# Patient Record
Sex: Female | Born: 1985 | Race: White | Hispanic: No | Marital: Single | Smoking: Never smoker
Health system: Southern US, Community
[De-identification: ages and names within clinical notes are randomized; demographics above are authoritative.]

## PROBLEM LIST (undated history)

## (undated) DIAGNOSIS — S060X9A Concussion with loss of consciousness of unspecified duration, initial encounter: Secondary | ICD-10-CM

## (undated) DIAGNOSIS — S060XAA Concussion with loss of consciousness status unknown, initial encounter: Secondary | ICD-10-CM

## (undated) HISTORY — PX: ANKLE SURGERY: SHX546

---

## 2006-04-23 ENCOUNTER — Other Ambulatory Visit: Admission: RE | Admit: 2006-04-23 | Discharge: 2006-04-23 | Payer: Self-pay | Admitting: Family Medicine

## 2008-02-07 ENCOUNTER — Other Ambulatory Visit: Admission: RE | Admit: 2008-02-07 | Discharge: 2008-02-07 | Payer: Self-pay | Admitting: Family Medicine

## 2008-04-14 ENCOUNTER — Emergency Department (HOSPITAL_COMMUNITY): Admission: EM | Admit: 2008-04-14 | Discharge: 2008-04-14 | Payer: Self-pay | Admitting: Emergency Medicine

## 2009-02-10 ENCOUNTER — Other Ambulatory Visit: Admission: RE | Admit: 2009-02-10 | Discharge: 2009-02-10 | Payer: Self-pay | Admitting: Family Medicine

## 2011-03-09 ENCOUNTER — Other Ambulatory Visit (HOSPITAL_COMMUNITY)
Admission: RE | Admit: 2011-03-09 | Discharge: 2011-03-09 | Disposition: A | Discharge status: Home / Self Care | Payer: BC Managed Care – PPO | Source: Ambulatory Visit | Attending: Family Medicine | Admitting: Family Medicine

## 2011-03-09 ENCOUNTER — Other Ambulatory Visit: Payer: Self-pay | Admitting: Physician Assistant

## 2011-03-09 DIAGNOSIS — Z0000 Encounter for general adult medical examination without abnormal findings: Secondary | ICD-10-CM | POA: Insufficient documentation

## 2013-06-06 ENCOUNTER — Encounter (HOSPITAL_COMMUNITY): Payer: Self-pay | Admitting: Emergency Medicine

## 2013-06-06 ENCOUNTER — Emergency Department (HOSPITAL_COMMUNITY): Payer: BC Managed Care – PPO

## 2013-06-06 ENCOUNTER — Emergency Department (HOSPITAL_COMMUNITY)
Admission: EM | Admit: 2013-06-06 | Discharge: 2013-06-06 | Disposition: A | Discharge status: Home / Self Care | Payer: BC Managed Care – PPO | Attending: Emergency Medicine | Admitting: Emergency Medicine

## 2013-06-06 DIAGNOSIS — S5010XA Contusion of unspecified forearm, initial encounter: Secondary | ICD-10-CM | POA: Insufficient documentation

## 2013-06-06 DIAGNOSIS — Y929 Unspecified place or not applicable: Secondary | ICD-10-CM | POA: Insufficient documentation

## 2013-06-06 DIAGNOSIS — S5011XA Contusion of right forearm, initial encounter: Secondary | ICD-10-CM

## 2013-06-06 DIAGNOSIS — IMO0002 Reserved for concepts with insufficient information to code with codable children: Secondary | ICD-10-CM | POA: Insufficient documentation

## 2013-06-06 DIAGNOSIS — Y9389 Activity, other specified: Secondary | ICD-10-CM | POA: Insufficient documentation

## 2013-06-06 NOTE — ED Notes (Signed)
Ice pack given to pt for R forearm.

## 2013-06-06 NOTE — ED Notes (Signed)
Pt states she flipped off a horse and got the wind got knocked out of her. Pt states her only injury is her R forearm. Pt has bruising and hematoma to R forearm. Pt denies neck or back pain. Pt ambulatory to exam room with steady gait.

## 2013-06-06 NOTE — ED Provider Notes (Signed)
CSN: [id number]     Arrival date & time 06/06/13  2116 History   First MD Initiated Contact with Patient 06/06/13 2127     Chief Complaint  Patient presents with  . Arm Injury     (Consider location/radiation/quality/duration/timing/severity/associated sxs/prior Treatment) HPI Patient fell off her horse earlier this evening and has ongoing right forearm pain with a small abrasion.  She initially had the wind knocked out of her.  She denies chest pain shortness breath now.  She presents with bruising and hematoma to her right forearm.  She denies numbness or weakness of her right hand.  No other complaints.  Pain is mild.   History reviewed. No pertinent past medical history. Past Surgical History  Procedure Laterality Date  . Ankle surgery     No family history on file. History  Substance Use Topics  . Smoking status: Not on file  . Smokeless tobacco: Not on file  . Alcohol Use: Not on file   OB History   Grav Para Term Preterm Abortions TAB SAB Ect Mult Living                 Review of Systems  All other systems reviewed and are negative.     Allergies  Review of patient's allergies indicates not on file.  Home Medications   Prior to Admission medications   Not on File   BP 105/68  Pulse 82  Temp(Src) 98.4 F (36.9 C) (Oral)  Resp 17  SpO2 100%  LMP 05/23/2013 Physical Exam  Nursing note and vitals reviewed. Constitutional: She is oriented to person, place, and time. She appears well-developed and well-nourished.  HENT:  Head: Normocephalic.  Eyes: EOM are normal.  Neck: Normal range of motion.  Pulmonary/Chest: Effort normal.  Abdominal: She exhibits no distension.  Musculoskeletal: Normal range of motion.  Small abrasion and hematoma to her right mid forearm at the ulnar aspect.  Normal right radial pulse  Neurological: She is alert and oriented to person, place, and time.  Psychiatric: She has a normal mood and affect.    ED Course  Procedures  (including critical care time) Labs Review Labs Reviewed - No data to display  Imaging Review Dg Forearm Right  06/06/2013   CLINICAL DATA:  Fall off of a horse mid forearm pain.  EXAM: RIGHT FOREARM - 2 VIEW  COMPARISON:  None.  FINDINGS: There is mild subcutaneous stranding a at the level of the mid forearm on the ulnar side that likely reflects focal bruising. Radius and ulna are intact. No acute fracture or focal bony abnormality is identified.  IMPRESSION: Mild focal soft tissue swelling ulnar side of the mid forearm. No acute bony abnormality.   Electronically Signed   By: Britta Mccreedy M.D.   On: 06/06/2013 21:54     EKG Interpretation None      MDM   Final diagnoses:  Contusion of right lower arm    Contusion or fracture.  Infection warnings.  No indication for additional testing.  Overall well-appearing.    Lyanne Co, MD 06/06/13 2201

## 2013-10-12 ENCOUNTER — Emergency Department (HOSPITAL_COMMUNITY): Payer: BC Managed Care – PPO

## 2013-10-12 ENCOUNTER — Emergency Department (HOSPITAL_COMMUNITY)
Admission: EM | Admit: 2013-10-12 | Discharge: 2013-10-12 | Disposition: A | Discharge status: Home / Self Care | Payer: BC Managed Care – PPO | Attending: Emergency Medicine | Admitting: Emergency Medicine

## 2013-10-12 ENCOUNTER — Encounter (HOSPITAL_COMMUNITY): Payer: Self-pay | Admitting: Emergency Medicine

## 2013-10-12 DIAGNOSIS — S59919A Unspecified injury of unspecified forearm, initial encounter: Secondary | ICD-10-CM

## 2013-10-12 DIAGNOSIS — S59909A Unspecified injury of unspecified elbow, initial encounter: Secondary | ICD-10-CM | POA: Insufficient documentation

## 2013-10-12 DIAGNOSIS — IMO0002 Reserved for concepts with insufficient information to code with codable children: Secondary | ICD-10-CM | POA: Insufficient documentation

## 2013-10-12 DIAGNOSIS — Y9389 Activity, other specified: Secondary | ICD-10-CM | POA: Diagnosis not present

## 2013-10-12 DIAGNOSIS — S0990XA Unspecified injury of head, initial encounter: Secondary | ICD-10-CM | POA: Diagnosis present

## 2013-10-12 DIAGNOSIS — Y929 Unspecified place or not applicable: Secondary | ICD-10-CM | POA: Insufficient documentation

## 2013-10-12 DIAGNOSIS — S6990XA Unspecified injury of unspecified wrist, hand and finger(s), initial encounter: Secondary | ICD-10-CM | POA: Diagnosis not present

## 2013-10-12 DIAGNOSIS — Z79899 Other long term (current) drug therapy: Secondary | ICD-10-CM | POA: Insufficient documentation

## 2013-10-12 HISTORY — DX: Concussion with loss of consciousness status unknown, initial encounter: S06.0XAA

## 2013-10-12 HISTORY — DX: Concussion with loss of consciousness of unspecified duration, initial encounter: S06.0X9A

## 2013-10-12 MED ORDER — OXYCODONE-ACETAMINOPHEN 5-325 MG PO TABS
2.0000 | ORAL_TABLET | Freq: Once | ORAL | Status: DC
  Filled 2013-10-12: qty 2

## 2013-10-12 MED ORDER — IBUPROFEN 800 MG PO TABS
800.0000 mg | ORAL_TABLET | Freq: Once | ORAL | Status: DC
  Filled 2013-10-12: qty 1

## 2013-10-12 NOTE — ED Notes (Signed)
MD at bedside. EDP YAO IN TO SEE THIS PT

## 2013-10-12 NOTE — ED Notes (Signed)
Family at bedside. 

## 2013-10-12 NOTE — ED Provider Notes (Signed)
CSN: [id number]     Arrival date & time 10/12/13  1225 History   First MD Initiated Contact with Patient 10/12/13 1240     Chief Complaint  Patient presents with  . thrown off horse, fall   . Shoulder Pain  . Arm Injury  . Arm Pain  . Hand Injury     (Consider location/radiation/quality/duration/timing/severity/associated sxs/prior Treatment) The history is provided by the patient.  Patricia Contreras is a 28 y.o. female here with s/p fall. She was riding a horse and a dog came in front and the horse was scarred and slipped and fell. She was thrown off the horse and landed on left side. She had some loss of consciousness afterwards but no vomiting. Complains of left facial pain and forehead pain and left forearm and shoulder pain.    Past Medical History  Diagnosis Date  . Concussion    Past Surgical History  Procedure Laterality Date  . Ankle surgery     No family history on file. History  Substance Use Topics  . Smoking status: Never Smoker   . Smokeless tobacco: Not on file  . Alcohol Use: Yes   OB History   Grav Para Term Preterm Abortions TAB SAB Ect Mult Living                 Review of Systems  Musculoskeletal:       L forearm pain, L back pain   All other systems reviewed and are negative.     Allergies  Review of patient's allergies indicates no known allergies.  Home Medications   Prior to Admission medications   Medication Sig Start Date End Date Taking? Authorizing Provider  drospirenone-ethinyl estradiol (GIANVI) 3-0.02 MG tablet Take 1 tablet by mouth daily with breakfast.   Yes Historical Provider, MD  ibuprofen (ADVIL,MOTRIN) 200 MG tablet Take 400 mg by mouth every 6 (six) hours as needed for moderate pain.   Yes Historical Provider, MD   BP 114/73  Pulse 85  Temp(Src) 97.6 F (36.4 C) (Oral)  Resp 16  SpO2 99%  LMP 09/21/2013 Physical Exam  Nursing note and vitals reviewed. Constitutional: She is oriented to person, place, and time.   Uncomfortable   HENT:  Head: Normocephalic.  Mouth/Throat: Oropharynx is clear and moist.  Forehead abrasion, + L cheek with abrasion minimally tender   Eyes: Conjunctivae and EOM are normal. Pupils are equal, round, and reactive to light.  Neck: Normal range of motion. Neck supple.  Cardiovascular: Normal rate, regular rhythm and normal heart sounds.   Pulmonary/Chest: Effort normal and breath sounds normal. No respiratory distress. She has no wheezes. She has no rales.  Bruise l scapula area   Abdominal: Soft. Bowel sounds are normal. She exhibits no distension. There is no tenderness. There is no rebound.  Musculoskeletal: Normal range of motion.  Minimal L forearm and elbow tenderness. No obvious deformity. Nl wrist ROM. Nl L shoulder ROM. No other obvious extremity trauma   Neurological: She is alert and oriented to person, place, and time.  Skin: Skin is warm and dry.  Psychiatric: She has a normal mood and affect. Her behavior is normal. Judgment and thought content normal.    ED Course  Procedures (including critical care time) Labs Review Labs Reviewed - No data to display  Imaging Review Dg Facial Bones Complete  10/12/2013   CLINICAL DATA:  Larey Seat off horse. Abrasion to the left forehead and cheek.  EXAM: FACIAL BONES COMPLETE 3+V  COMPARISON:  Head CT 10/12/2013  FINDINGS: Mastoid air cells are aerated bilaterally. No evidence for a displaced facial bone fracture. No acute bone abnormality to the mandible. Zygomatic arches are intact. Paranasal sinuses are aerated.  IMPRESSION: No acute bone abnormality.   Electronically Signed   By: Richarda Overlie M.D.   On: 10/12/2013 14:12   Dg Chest 2 View  10/12/2013   CLINICAL DATA:  Status post fall with left scapular pain.  EXAM: CHEST  2 VIEW  COMPARISON:  None.  FINDINGS: The heart size and mediastinal contours are within normal limits. Both lungs are clear. No pneumothorax or pleural effusion is noted. The visualized skeletal structures  are unremarkable.  IMPRESSION: No acute cardiopulmonary abnormality seen.   Electronically Signed   By: Roque Lias M.D.   On: 10/12/2013 14:08   Dg Cervical Spine Complete  10/12/2013   CLINICAL DATA:  Neck pain after fall.  EXAM: CERVICAL SPINE  4+ VIEWS  COMPARISON:  April 14, 2008.  FINDINGS: There is no evidence of cervical spine fracture or prevertebral soft tissue swelling. Alignment is normal. No other significant bone abnormalities are identified.  IMPRESSION: Negative cervical spine radiographs.   Electronically Signed   By: Roque Lias M.D.   On: 10/12/2013 14:12   Dg Forearm Left  10/12/2013   CLINICAL DATA:  Shoulder pain.  Arm injury.  Hand injury.  EXAM: LEFT FOREARM - 2 VIEW  COMPARISON:  None.  FINDINGS: There is no evidence of fracture or other focal bone lesions. Debris or a skin lesion is identified along the ulnar aspect of the midforearm.  IMPRESSION: 1.  No evidence for acute osseous abnormality. 2. Debris versus skin lesion.  See above.   Electronically Signed   By: Rosalie Gums M.D.   On: 10/12/2013 14:05   Ct Head Wo Contrast  10/12/2013   CLINICAL DATA:  Head injury, fall from horse  EXAM: CT HEAD WITHOUT CONTRAST  TECHNIQUE: Contiguous axial images were obtained from the base of the skull through the vertex without contrast.  COMPARISON:  None  FINDINGS: Normal appearance of the intracranial structures. No evidence for acute hemorrhage, mass lesion, midline shift, hydrocephalus or large infarct. No acute bony abnormality. The visualized sinuses are clear.  IMPRESSION: No acute intracranial abnormality.   Electronically Signed   By: Ruel Favors M.D.   On: 10/12/2013 14:44     EKG Interpretation None      MDM   Final diagnoses:  None    Patricia Contreras is a 28 y.o. female here with s/p fall from horse. Has LOC so will get CT head. Will get xrays to r/o fracture.   2:54 PM CT head nl. xrays showed no fracture. Offered pain meds but she refused. Recommend motrin,  tylenol.     Richardean Canal, MD 10/12/13 [phone]

## 2013-10-12 NOTE — ED Notes (Signed)
Bed: WA20 Expected date:  Expected time:  Means of arrival:  Comments: ems- came off horse

## 2013-10-12 NOTE — ED Notes (Signed)
Per [facility] Pt reports horseback riding and fell off horse landing on left side resulting into injury to left shoulder and forearm pain. No deformity present. Forearm splinted. Good CMS present. Pain only with ROM to left shoulder. NO LOC. Denies neck and back pain . [facility] cleared on scene. Abrasion to left forehead and cheek. Helmet intact. No crack with no significant damage. Denies N/V. Pt ambulatory on scene. Estimates horse to be 5 feet tall. Pt fell from horse. Pt reports no contact with horse from fall.

## 2013-10-12 NOTE — Discharge Instructions (Signed)
Take motrin 600 mg every 6 hrs for the next 2 days then as needed.   Take tylenol as needed for pain.   Apply ice today.   Follow up with your doctor.   No heavy lifting or horse back riding for a week.   Return to ER if you have severe pain, headaches, vomiting.

## 2013-10-12 NOTE — ED Notes (Signed)
Patient transported to X-ray 

## 2013-10-12 NOTE — ED Notes (Signed)
MD at bedside.  EDP YAO PRESENT TO EVALUATE PT UPON ARRIVAL TO ROOM

## 2013-10-20 ENCOUNTER — Other Ambulatory Visit: Payer: Self-pay | Admitting: Obstetrics and Gynecology

## 2013-10-20 ENCOUNTER — Other Ambulatory Visit (HOSPITAL_COMMUNITY)
Admission: RE | Admit: 2013-10-20 | Discharge: 2013-10-20 | Disposition: A | Discharge status: Home / Self Care | Payer: BC Managed Care – PPO | Source: Ambulatory Visit | Attending: Obstetrics and Gynecology | Admitting: Obstetrics and Gynecology

## 2013-10-20 DIAGNOSIS — Z01419 Encounter for gynecological examination (general) (routine) without abnormal findings: Secondary | ICD-10-CM | POA: Diagnosis present

## 2013-10-21 LAB — CYTOLOGY - PAP

## 2015-09-27 IMAGING — CR DG FACIAL BONES COMPLETE 3+V
5 series · 5 of 5 positions shown · non-contrast
Comparison: Head CT 10/12/2013

CLINICAL DATA: Fell off horse. Abrasion to the left forehead and
cheek.

EXAM:
FACIAL BONES COMPLETE 3+V

[w waters pa]
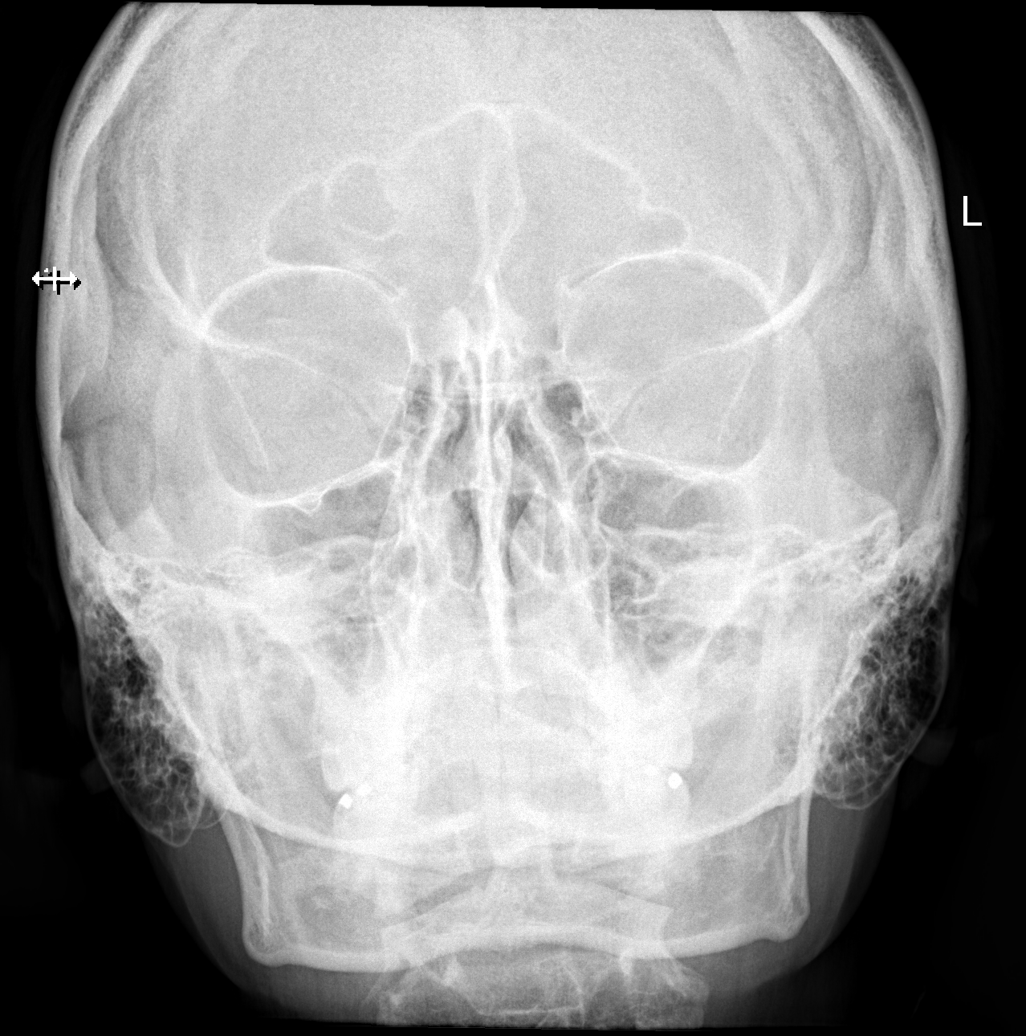

[[person_name] pa]
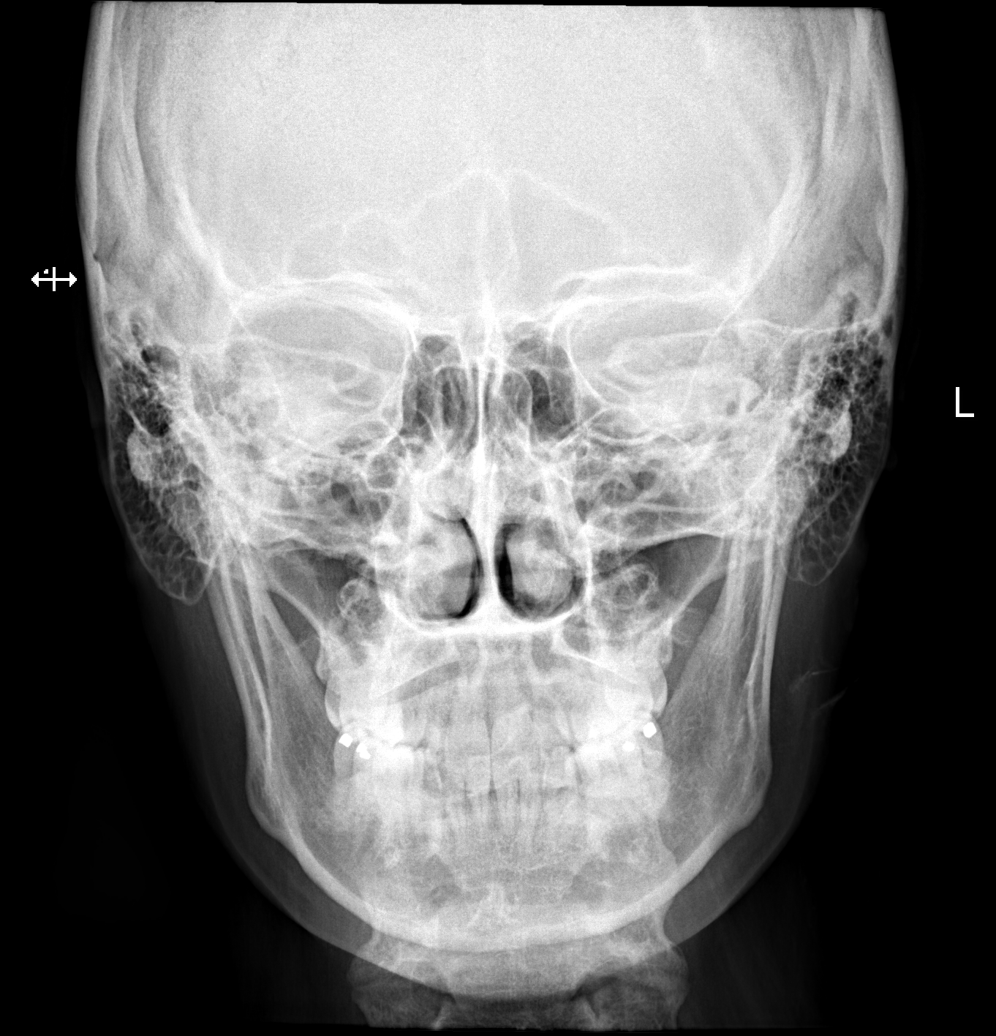

[[person_name]]
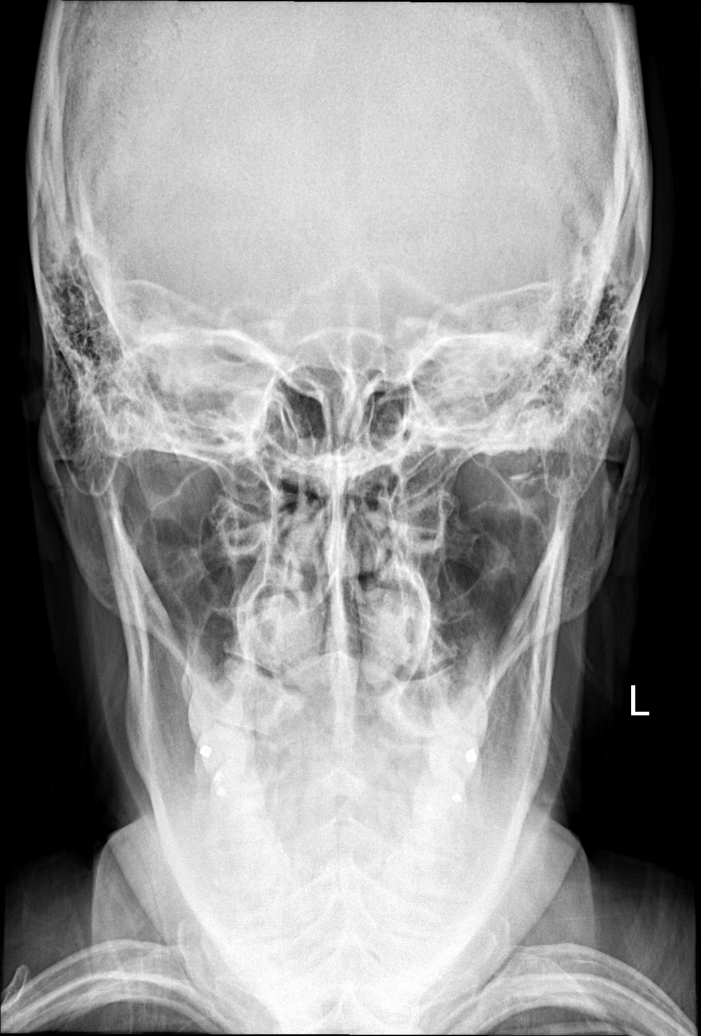

[w smv]
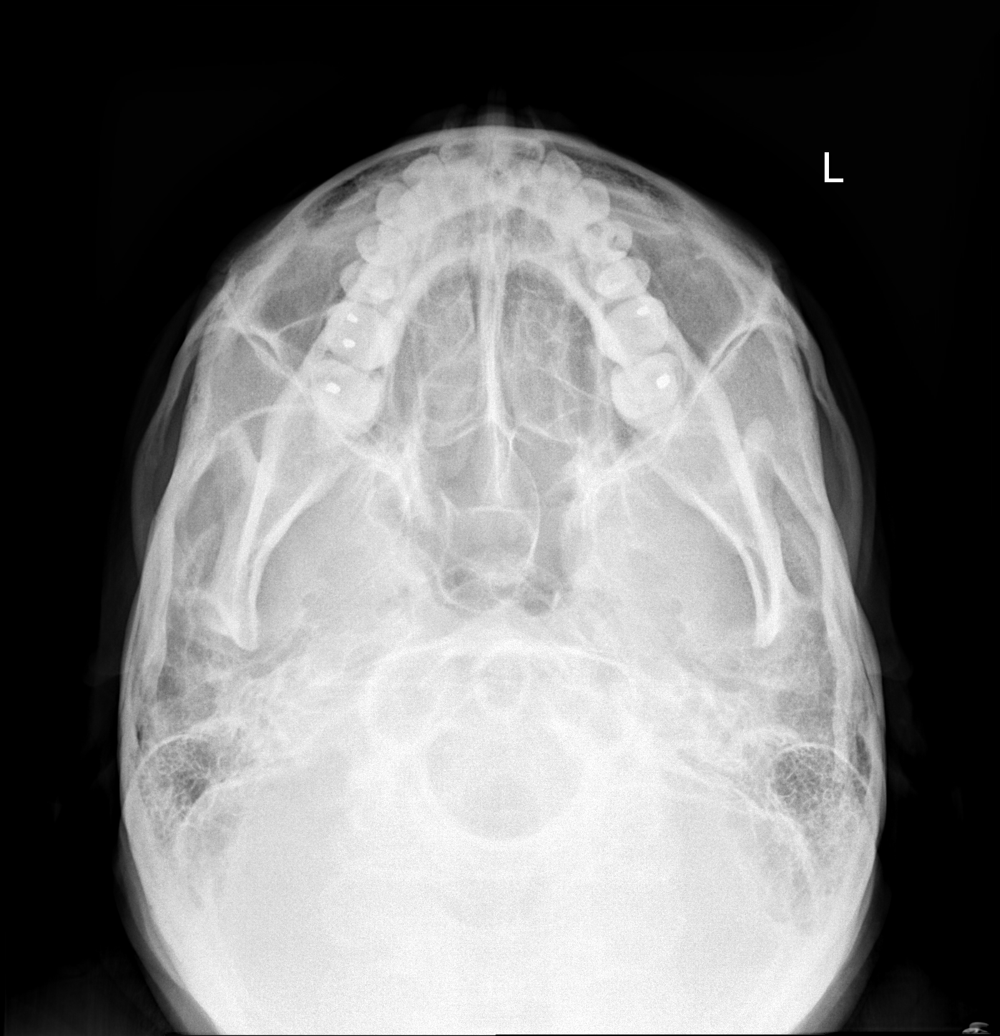

[w skull lat]
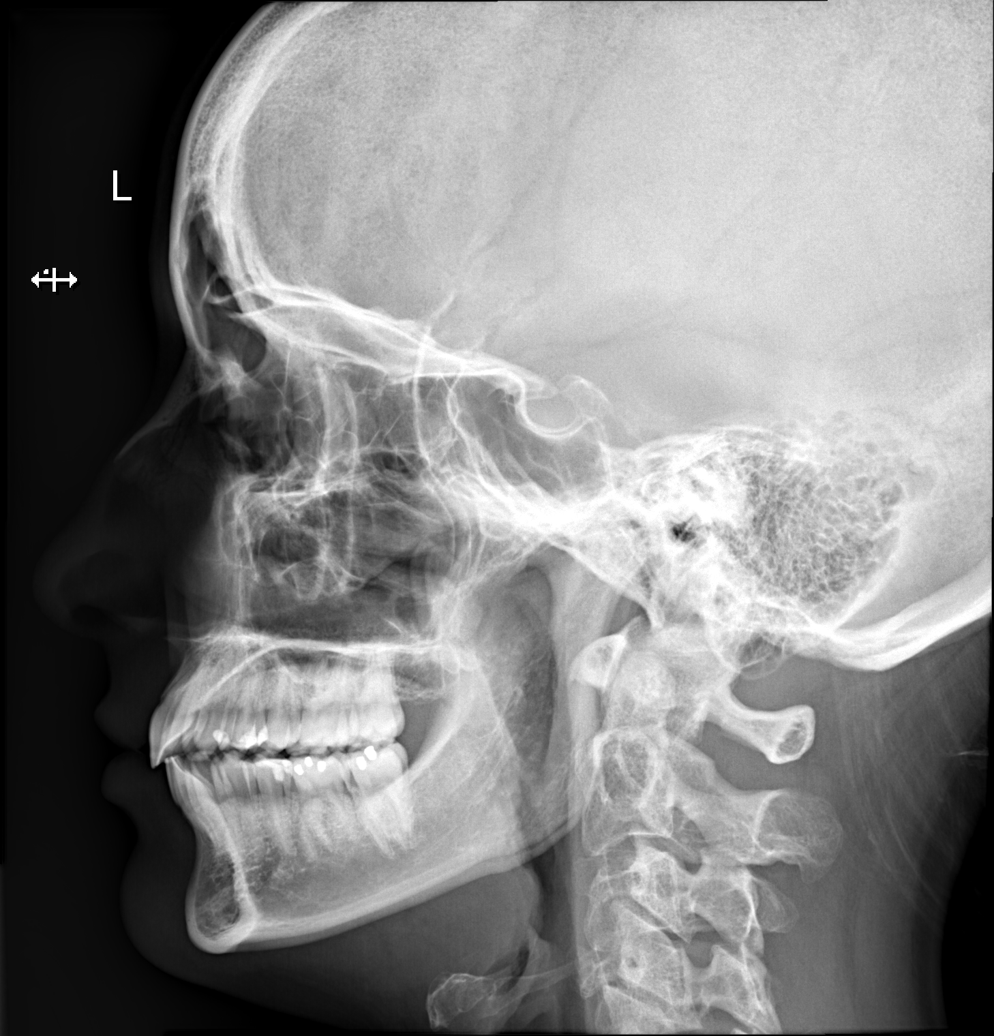

[5 of 5 positions shown; findings below may reference images not displayed]

FINDINGS: Mastoid air cells are aerated bilaterally. No evidence for a
displaced facial bone fracture. No acute bone abnormality to the
mandible. Zygomatic arches are intact. Paranasal sinuses are
aerated.
IMPRESSION: No acute bone abnormality.

## 2015-09-27 IMAGING — CR DG CERVICAL SPINE COMPLETE 4+V
5 series · 5 of 5 positions shown · non-contrast
Comparison: April 14, 2008.

CLINICAL DATA: Neck pain after fall.

EXAM:
CERVICAL SPINE  4+ VIEWS

[w cervical spine lat]
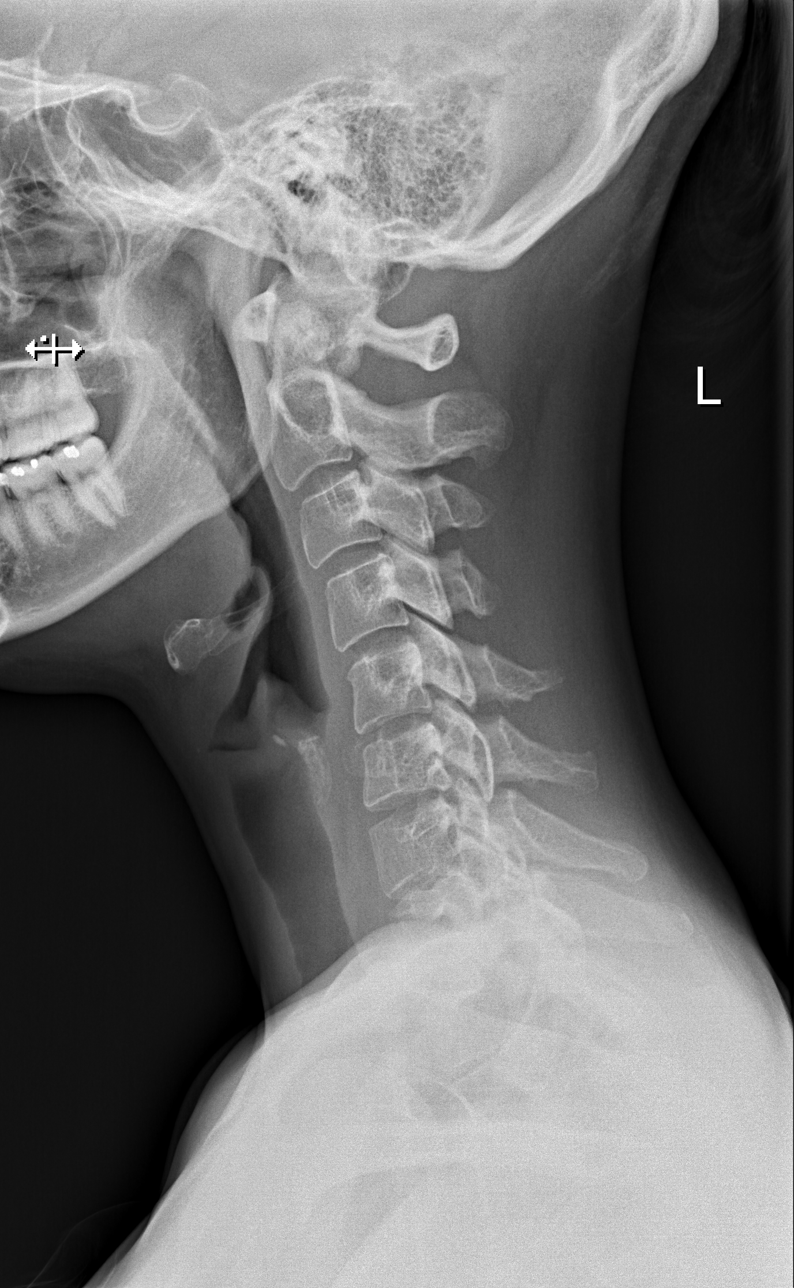

[w cervical spine ap_obl (1 of 2)]
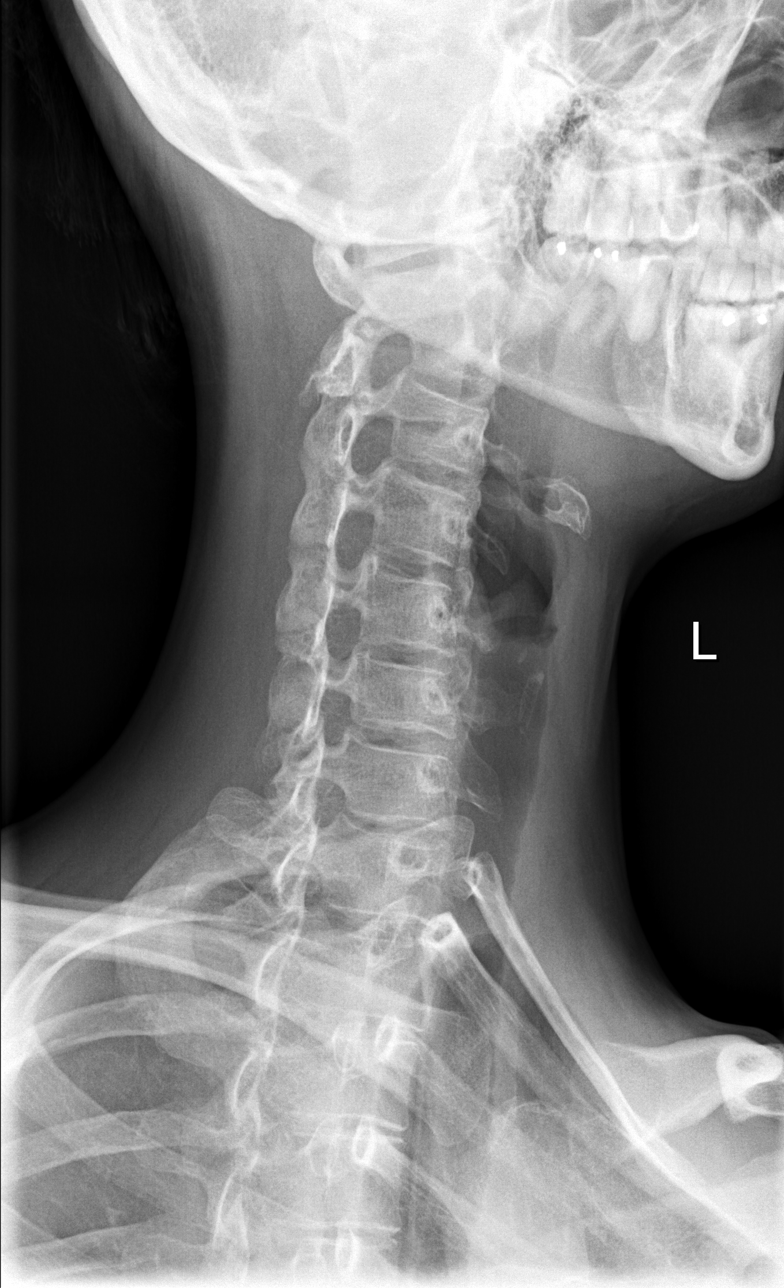

[w cervical spine ap_obl (2 of 2)]
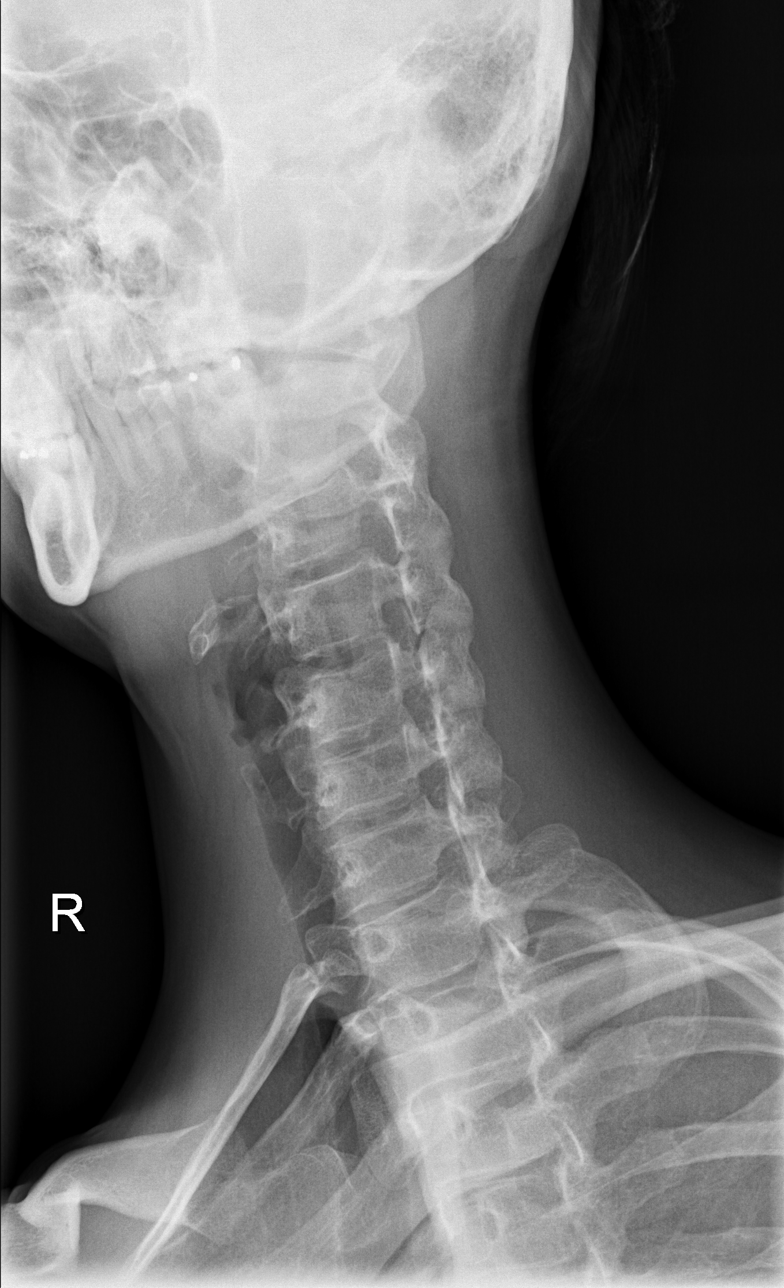

[w cervical spine ap]
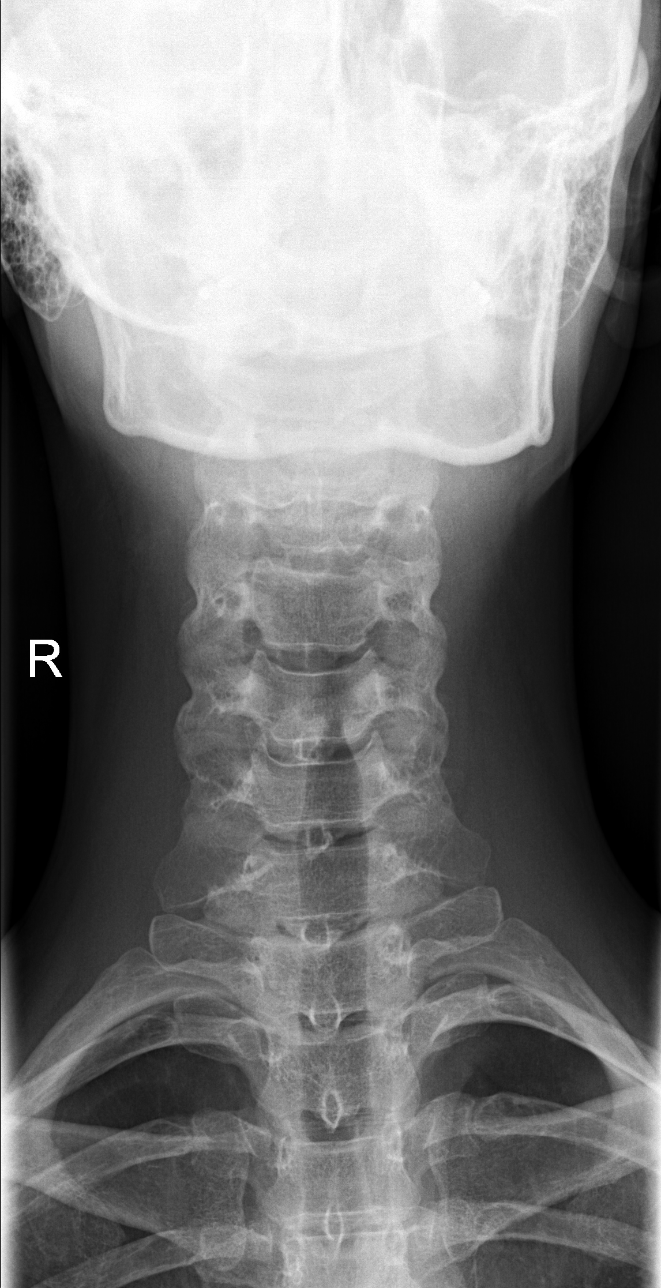

[w cervical spine odontoid]
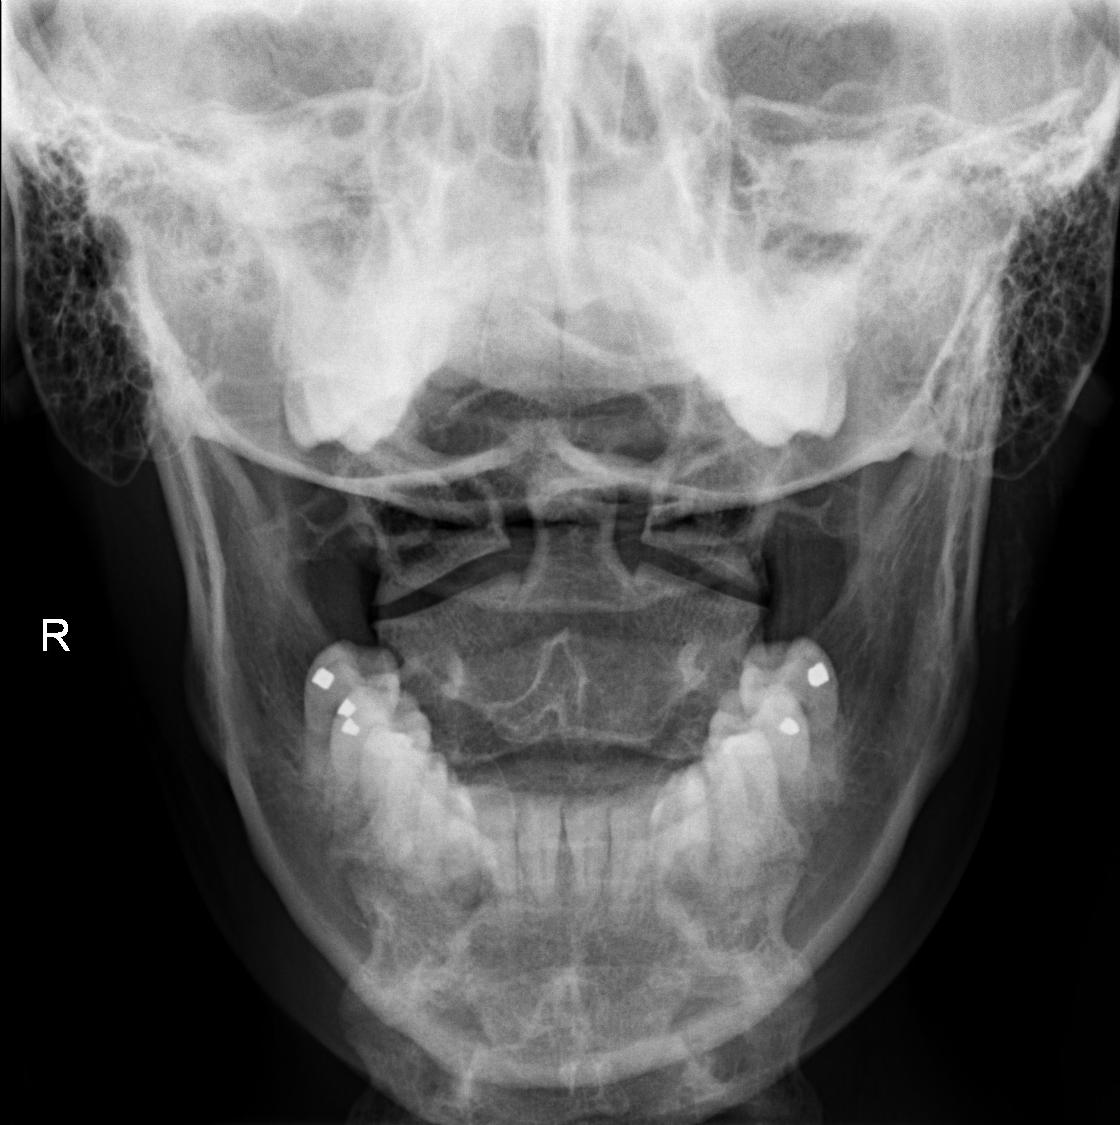

[5 of 5 positions shown; findings below may reference images not displayed]

FINDINGS: There is no evidence of cervical spine fracture or prevertebral soft
tissue swelling. Alignment is normal. No other significant bone
abnormalities are identified.
IMPRESSION: Negative cervical spine radiographs.

## 2015-09-27 IMAGING — CT CT HEAD W/O CM
2 series · 17 of 30 positions shown, 20 images · non-contrast
Comparison: None

CLINICAL DATA: Head injury, fall from horse

EXAM:
CT HEAD WITHOUT CONTRAST
TECHNIQUE: Contiguous axial images were obtained from the base of the skull
through the vertex without contrast.

[Series 2: head w/o · axial · non-contrast · 0.45mm/px · z∈[-148,-28]mm · 9 of 31 slices shown, 12 images]
[im 4/31  brain]
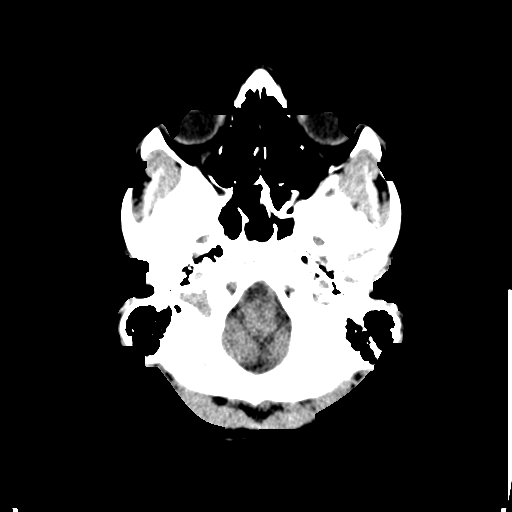
[im 4/31  bone]
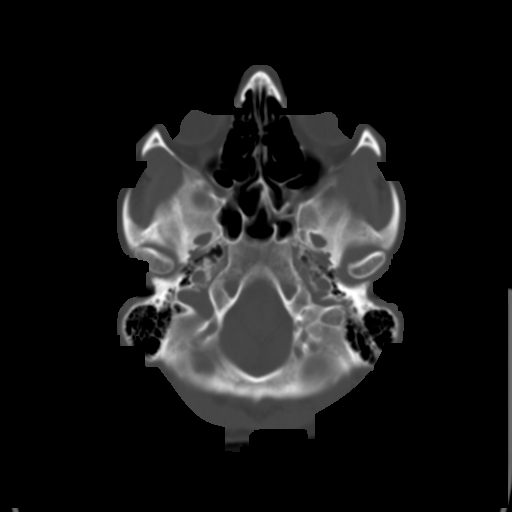
[im 7/31  brain]
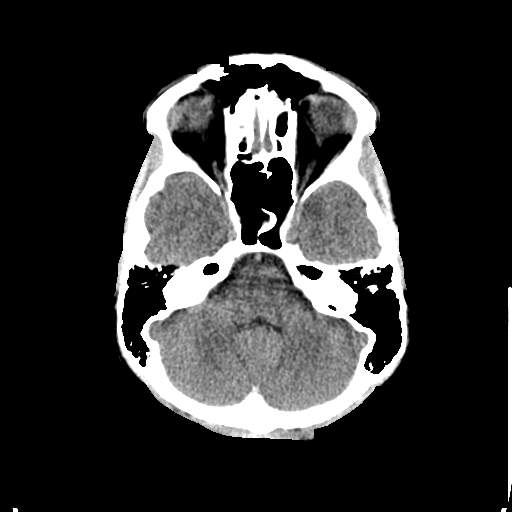
[im 10/31  brain]
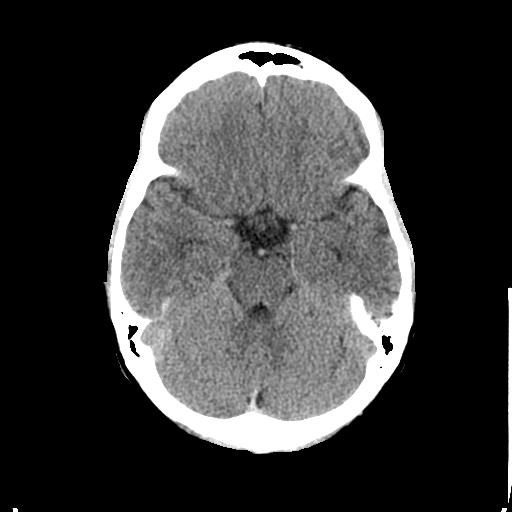
[im 13/31  brain]
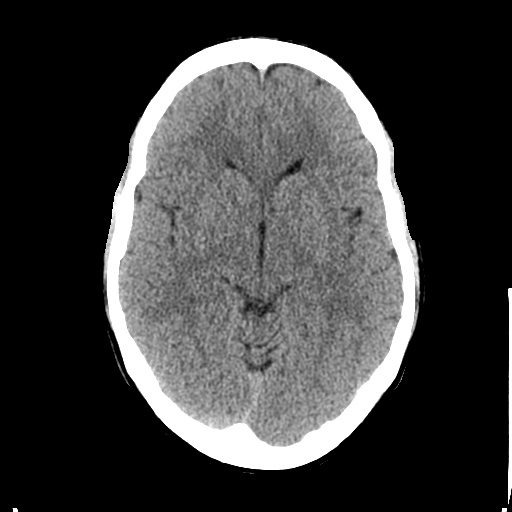
[im 16/31  brain]
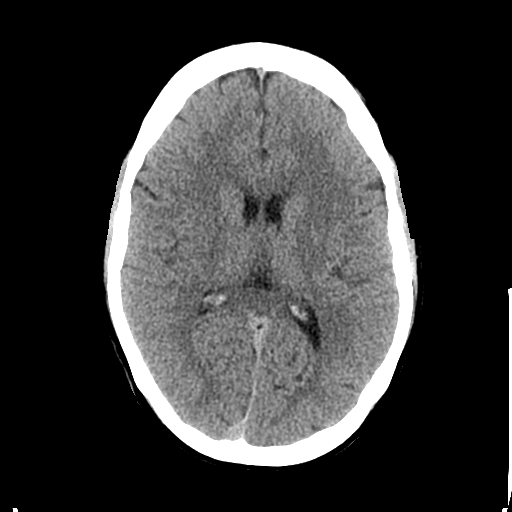
[im 16/31  bone]
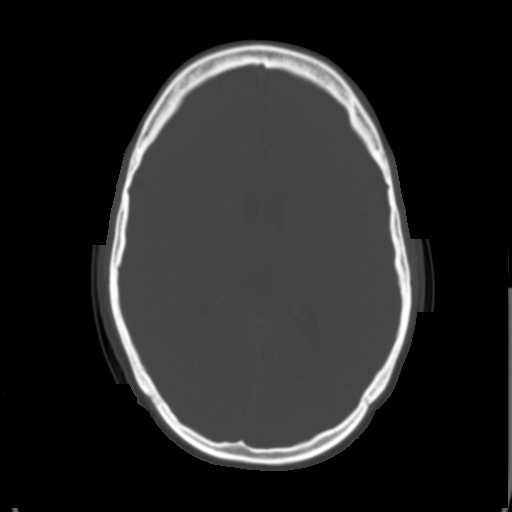
[im 19/31  brain]
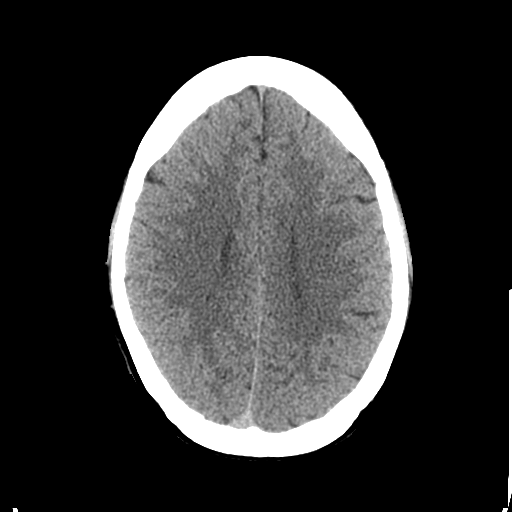
[im 22/31  brain]
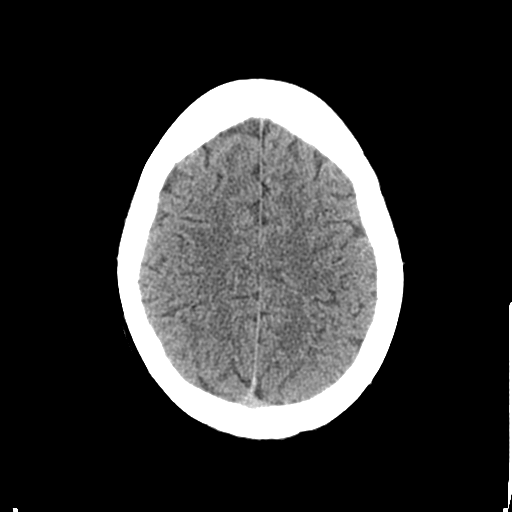
[im 25/31  brain]
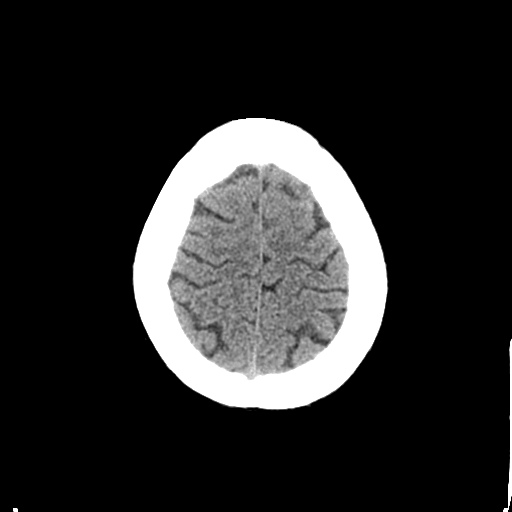
[im 28/31  brain]
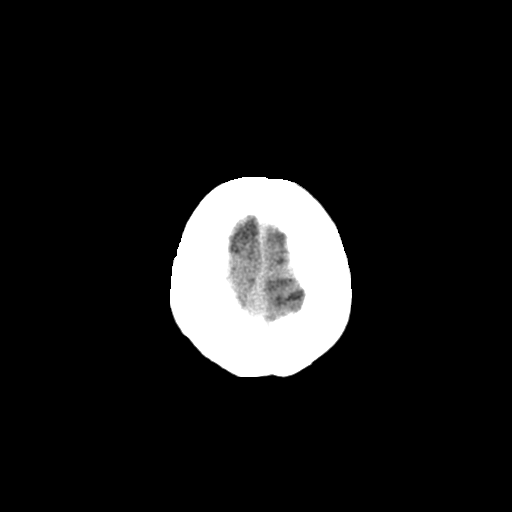
[im 28/31  bone]
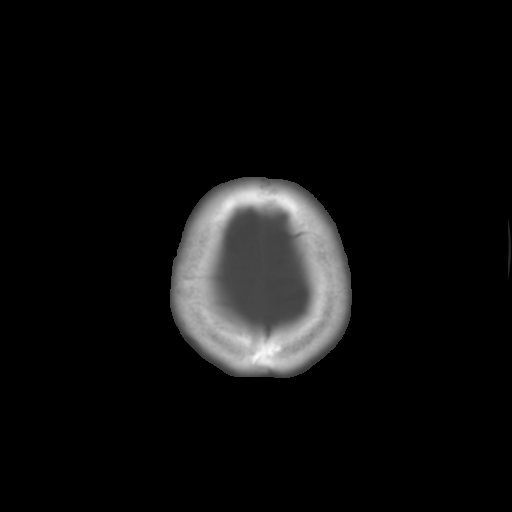

[Series 3: bone windows · axial · 0.45mm/px · z∈[-148,-31]mm · 8 of 51 slices shown]
[im 6/51  bone]
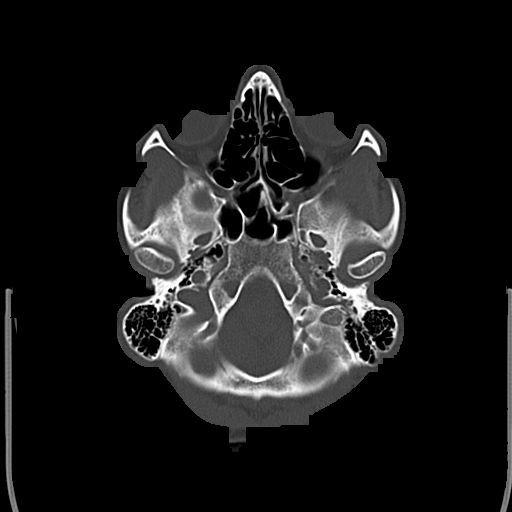
[im 12/51  bone]
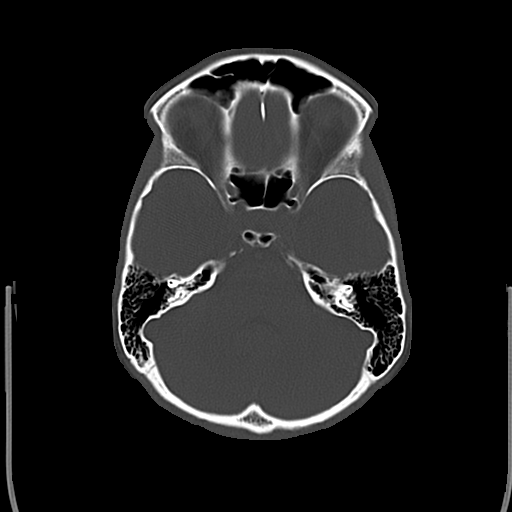
[im 17/51  bone]
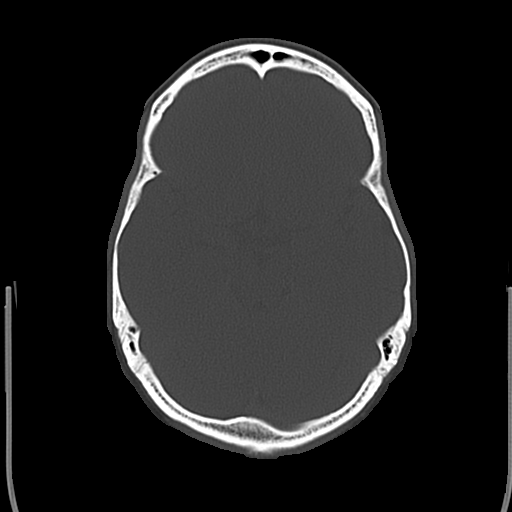
[im 23/51  bone]
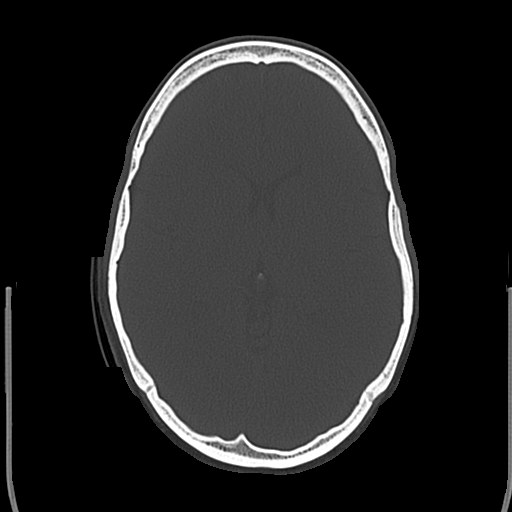
[im 28/51  bone]
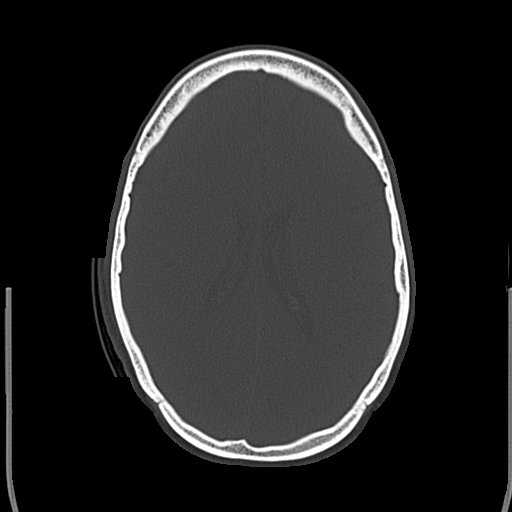
[im 34/51  bone]
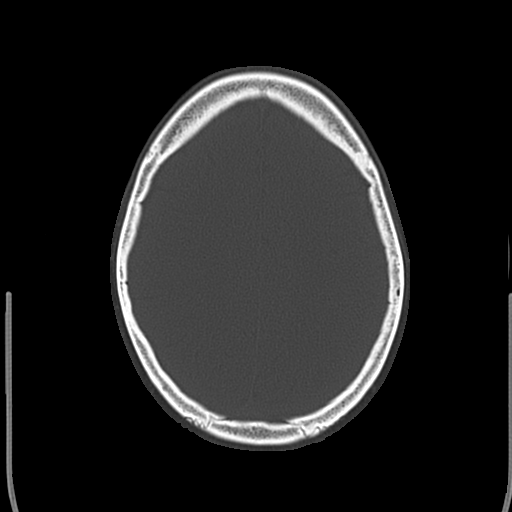
[im 39/51  bone]
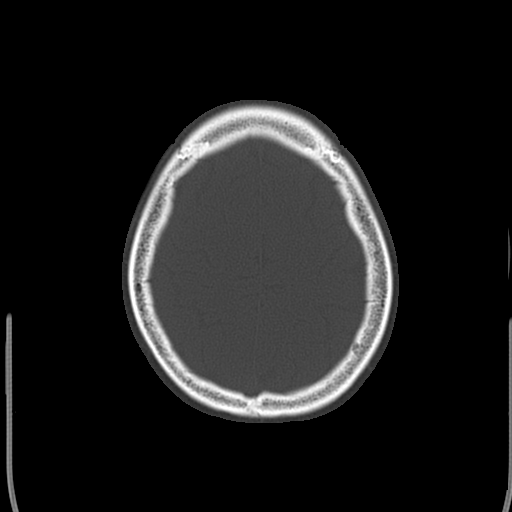
[im 45/51  bone]
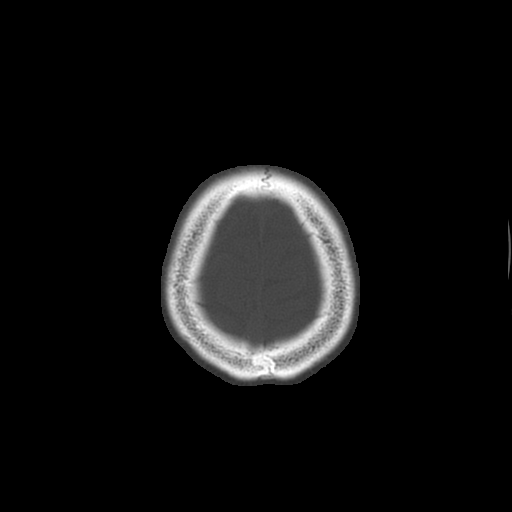

[17 of 30 positions shown; findings below may reference images not displayed]

FINDINGS: Normal appearance of the intracranial structures. No evidence for
acute hemorrhage, mass lesion, midline shift, hydrocephalus or large
infarct. No acute bony abnormality. The visualized sinuses are
clear.
IMPRESSION: No acute intracranial abnormality.
